# Patient Record
Sex: Female | Born: 2005 | Race: Black or African American | Hispanic: No | Marital: Single | Smoking: Never smoker
Health system: Southern US, Community
[De-identification: ages and names within clinical notes are randomized; demographics above are authoritative.]

## PROBLEM LIST (undated history)

## (undated) DIAGNOSIS — F32A Depression, unspecified: Secondary | ICD-10-CM

## (undated) DIAGNOSIS — J45909 Unspecified asthma, uncomplicated: Secondary | ICD-10-CM

## (undated) DIAGNOSIS — F329 Major depressive disorder, single episode, unspecified: Secondary | ICD-10-CM

## (undated) DIAGNOSIS — F909 Attention-deficit hyperactivity disorder, unspecified type: Secondary | ICD-10-CM

---
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
## 2024-03-04 ENCOUNTER — Ambulatory Visit: Admitting: *Deleted

## 2024-03-04 DIAGNOSIS — Z3042 Encounter for surveillance of injectable contraceptive: Secondary | ICD-10-CM

## 2024-03-04 MED ORDER — MEDROXYPROGESTERONE ACETATE 150 MG/ML IM SUSP
150.0000 mg | Freq: Once | INTRAMUSCULAR | Status: AC
  Administered 2024-03-04: 150 mg via INTRAMUSCULAR

## 2024-03-04 NOTE — Progress Notes (Signed)
   NURSE VISIT- INJECTION  SUBJECTIVE:  Mercedes Ramos is a 18 y.o. G0P0000 female here for a Depo Provera  for contraception/period management. She is a GYN patient.   OBJECTIVE:  There were no vitals taken for this visit.  Appears well, in no apparent distress  Injection administered in: Left deltoid  No orders of the defined types were placed in this encounter.   ASSESSMENT: GYN patient Depo Provera  for contraception/period management PLAN: Follow-up: in 11-13 weeks for next Depo   Alan LITTIE Fischer  03/04/2024 2:25 PM

## 2024-05-27 ENCOUNTER — Ambulatory Visit

## 2024-05-27 ENCOUNTER — Ambulatory Visit (INDEPENDENT_AMBULATORY_CARE_PROVIDER_SITE_OTHER): Admitting: *Deleted

## 2024-05-27 DIAGNOSIS — Z3042 Encounter for surveillance of injectable contraceptive: Secondary | ICD-10-CM | POA: Diagnosis not present

## 2024-05-27 MED ORDER — MEDROXYPROGESTERONE ACETATE 150 MG/ML IM SUSP
150.0000 mg | Freq: Once | INTRAMUSCULAR | Status: AC
  Administered 2024-05-27: 150 mg via INTRAMUSCULAR

## 2024-05-27 NOTE — Progress Notes (Signed)
   NURSE VISIT- INJECTION  SUBJECTIVE:  Mercedes Ramos is a 18 y.o. G0P0000 female here for a Depo Provera  for contraception/period management. She is a GYN patient.   OBJECTIVE:  There were no vitals taken for this visit.  Appears well, in no apparent distress  Injection administered in: Right deltoid  Meds ordered this encounter  Medications   medroxyPROGESTERone  (DEPO-PROVERA ) injection 150 mg    ASSESSMENT: GYN patient Depo Provera  for contraception/period management PLAN: Follow-up: in 11-13 weeks for next Depo   Alan LITTIE Fischer  05/27/2024 3:02 PM

## 2024-08-19 ENCOUNTER — Ambulatory Visit
